# Patient Record
Sex: Male | Born: 1960 | Hispanic: No | Marital: Married | Smoking: Never smoker
Health system: Southern US, Community
[De-identification: ages and names within clinical notes are randomized; demographics above are authoritative.]

## PROBLEM LIST (undated history)

## (undated) HISTORY — PX: COLONOSCOPY: SHX174

---

## 2004-06-13 ENCOUNTER — Encounter: Admission: RE | Admit: 2004-06-13 | Discharge: 2004-06-13 | Payer: Self-pay | Admitting: Family Medicine

## 2006-03-17 IMAGING — US US ABDOMEN COMPLETE
1 series · 14 of 25 positions shown · non-contrast
Comparison: none

CLINICAL DATA: Left upper quadrant pain.
 ULTRASOUND ABDOMEN, COMPLETE:
 There is a fold noted within the gallbladder.  There is no evidence for cholelithiasis and there is no pericholecystic fluid.  The gallbladder wall thickness is normal.  The common bile duct is normal in caliber (3 mm).  The liver, spleen, and kidneys have normal appearance.  There is limited visualization of the inferior vena cava, abdominal aorta, and pancreas due to overlying gas.

[Series 1: unknown · 0.27mm/px · 14 of 62 slices shown]
[im 1/62]
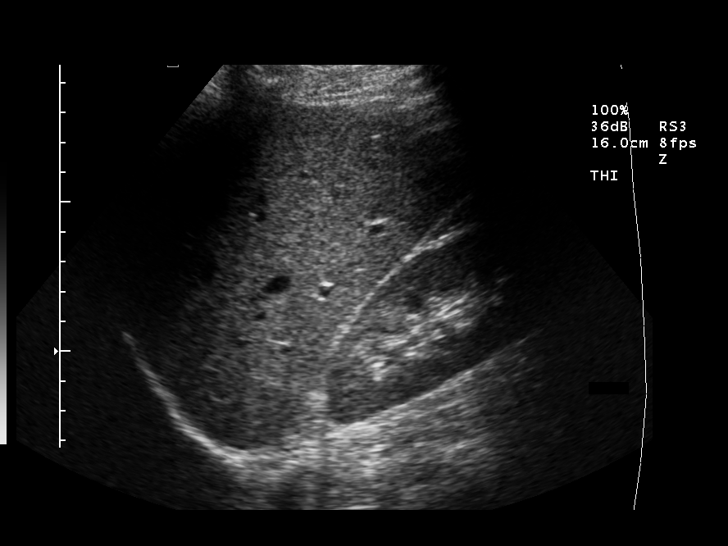
[im 6/62]
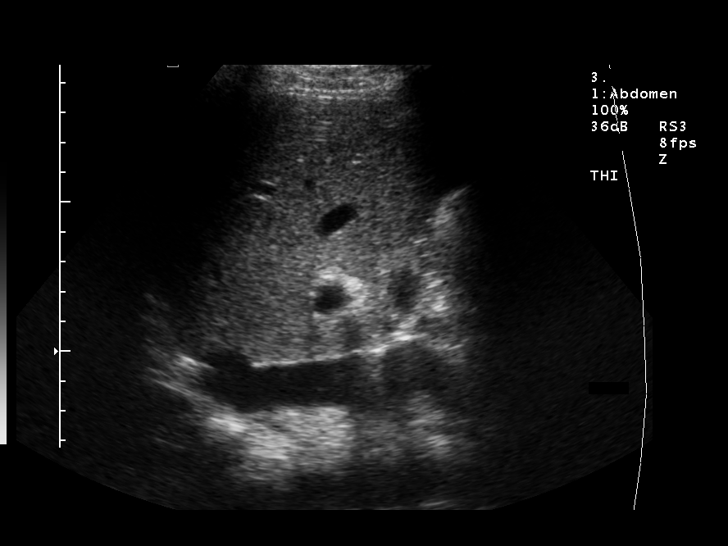
[im 11/62]
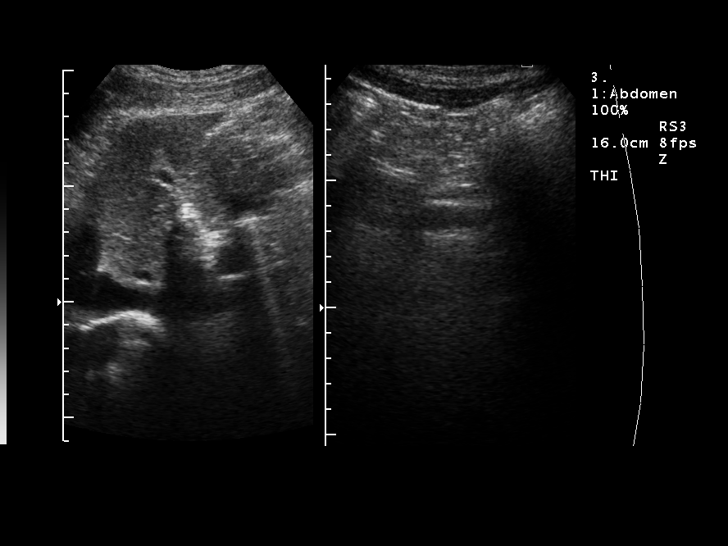
[im 16/62]
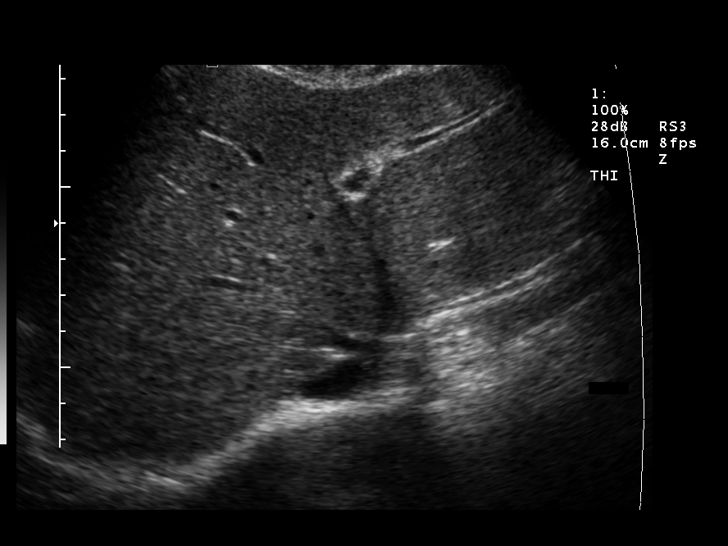
[im 21/62]
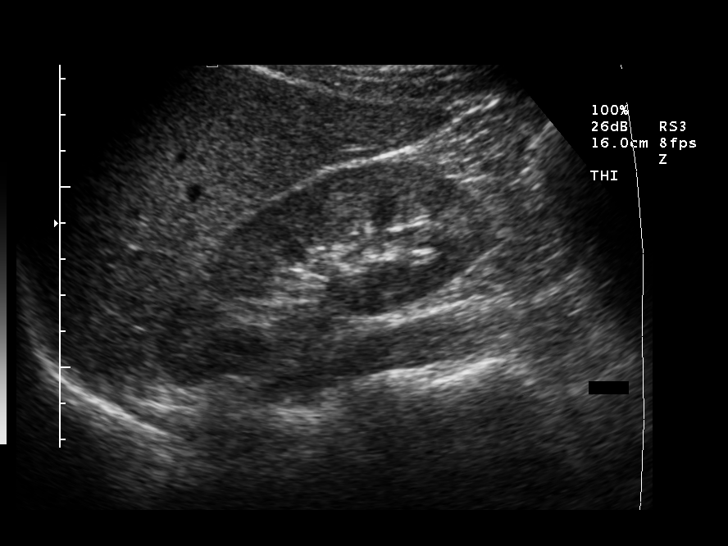
[im 23/62]
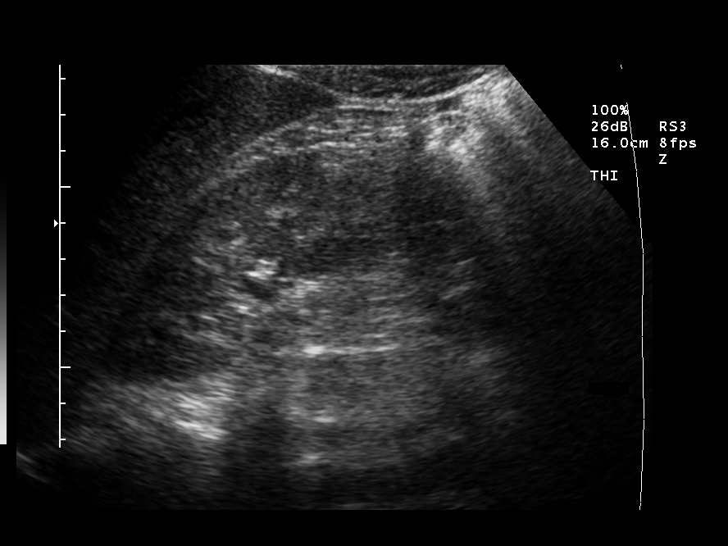
[im 28/62]
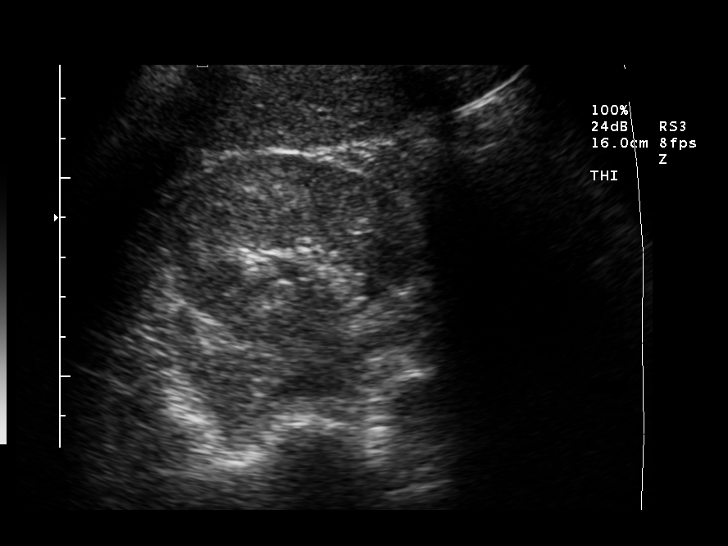
[im 34/62]
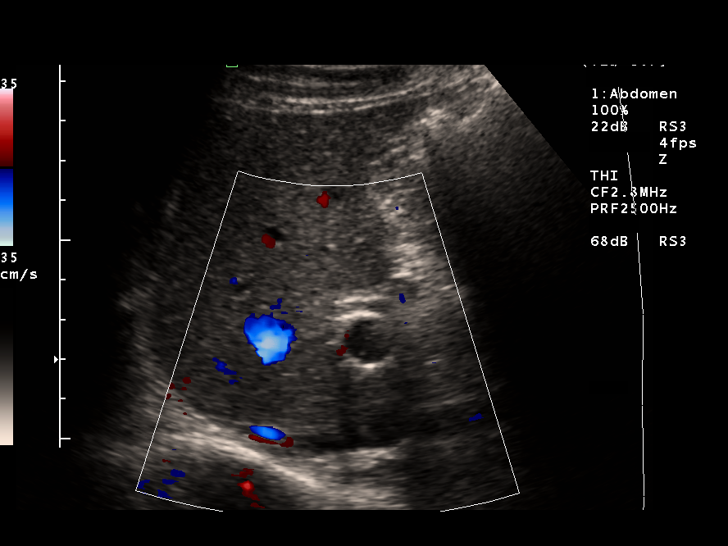
[im 39/62]
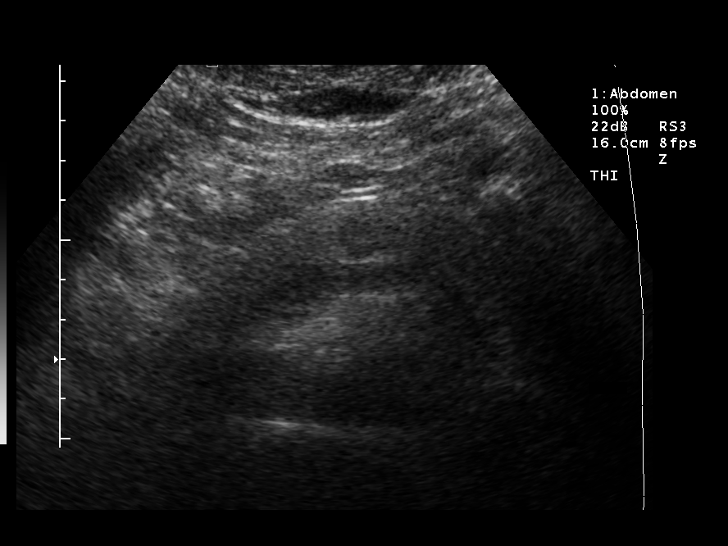
[im 41/62]
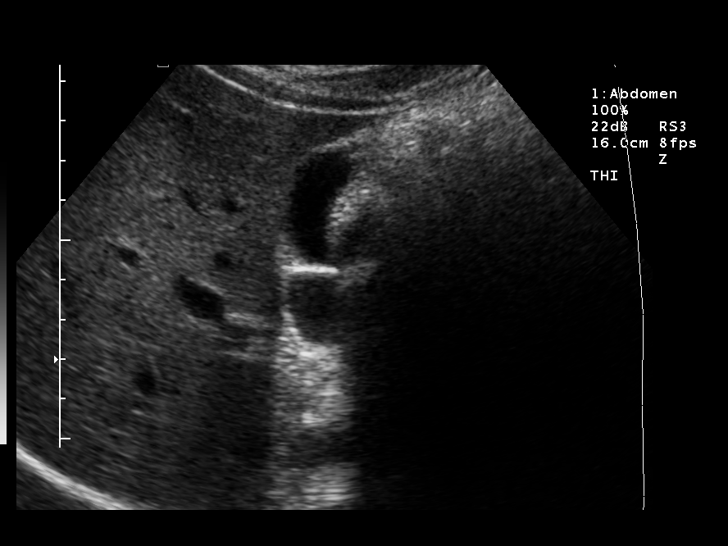
[im 46/62]
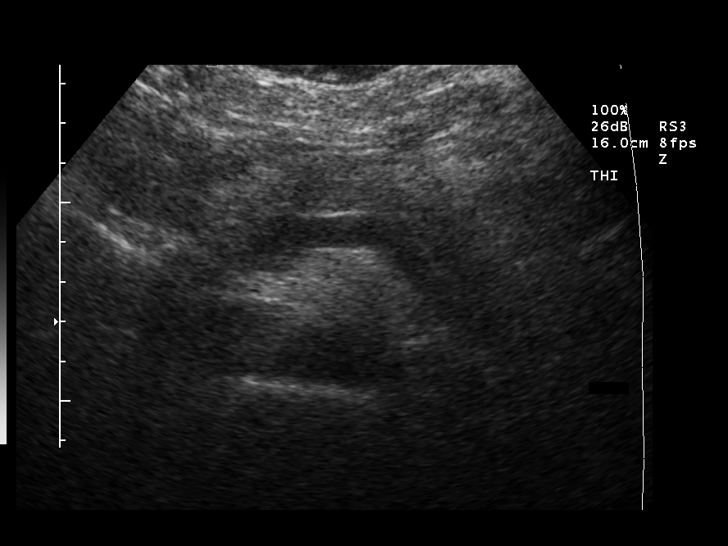
[im 51/62]
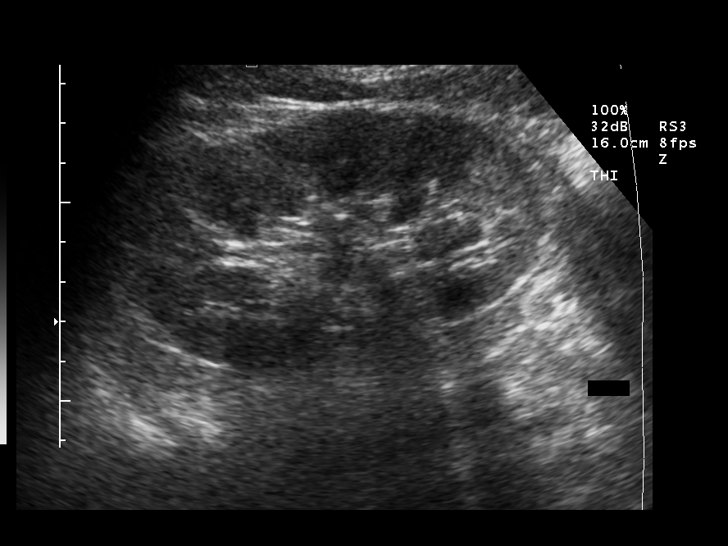
[im 56/62]
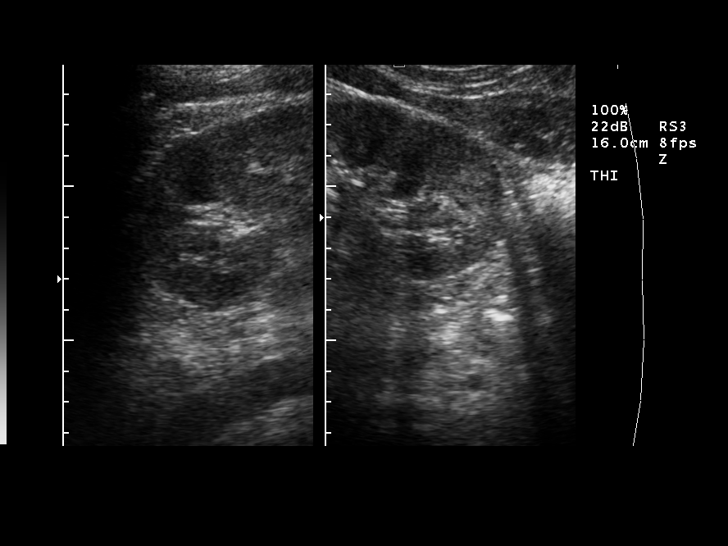
[im 62/62]
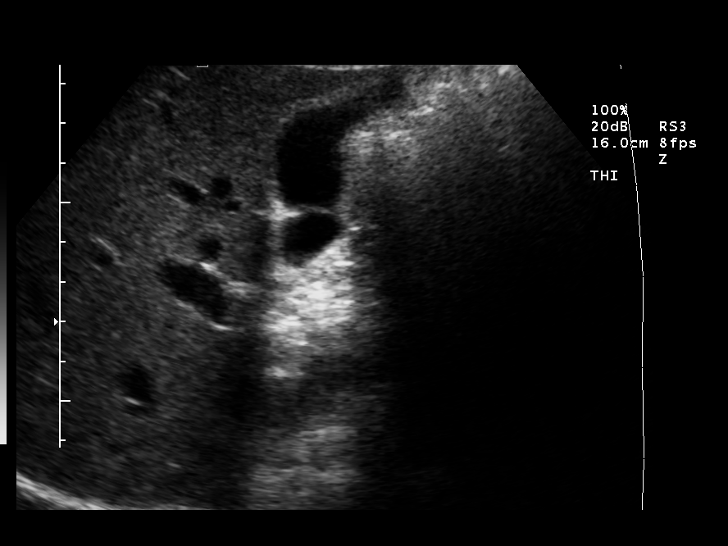

[14 of 25 positions shown; findings below may reference images not displayed]

IMPRESSION: Normal appearing gallbladder and common bile duct.  Limited visualization of the inferior vena cava, aorta, and pancreas due to overlying gas.

## 2010-07-15 ENCOUNTER — Encounter: Payer: Self-pay | Admitting: Family Medicine

## 2011-05-23 DIAGNOSIS — J309 Allergic rhinitis, unspecified: Secondary | ICD-10-CM | POA: Insufficient documentation

## 2011-05-23 DIAGNOSIS — Z87448 Personal history of other diseases of urinary system: Secondary | ICD-10-CM | POA: Insufficient documentation

## 2011-05-23 DIAGNOSIS — N411 Chronic prostatitis: Secondary | ICD-10-CM | POA: Insufficient documentation

## 2011-05-23 DIAGNOSIS — G44209 Tension-type headache, unspecified, not intractable: Secondary | ICD-10-CM | POA: Insufficient documentation

## 2011-05-23 DIAGNOSIS — J45909 Unspecified asthma, uncomplicated: Secondary | ICD-10-CM | POA: Insufficient documentation

## 2011-05-23 DIAGNOSIS — N400 Enlarged prostate without lower urinary tract symptoms: Secondary | ICD-10-CM | POA: Insufficient documentation

## 2011-05-23 DIAGNOSIS — A159 Respiratory tuberculosis unspecified: Secondary | ICD-10-CM | POA: Insufficient documentation

## 2011-05-24 DIAGNOSIS — Z0000 Encounter for general adult medical examination without abnormal findings: Secondary | ICD-10-CM | POA: Insufficient documentation

## 2011-12-20 DIAGNOSIS — R079 Chest pain, unspecified: Secondary | ICD-10-CM | POA: Insufficient documentation

## 2016-12-03 DIAGNOSIS — Z0000 Encounter for general adult medical examination without abnormal findings: Secondary | ICD-10-CM | POA: Diagnosis not present

## 2016-12-03 DIAGNOSIS — Z125 Encounter for screening for malignant neoplasm of prostate: Secondary | ICD-10-CM | POA: Diagnosis not present

## 2016-12-03 DIAGNOSIS — Z6820 Body mass index (BMI) 20.0-20.9, adult: Secondary | ICD-10-CM | POA: Diagnosis not present

## 2017-03-15 DIAGNOSIS — J069 Acute upper respiratory infection, unspecified: Secondary | ICD-10-CM | POA: Diagnosis not present

## 2017-03-29 DIAGNOSIS — J203 Acute bronchitis due to coxsackievirus: Secondary | ICD-10-CM | POA: Diagnosis not present

## 2017-04-19 DIAGNOSIS — J302 Other seasonal allergic rhinitis: Secondary | ICD-10-CM | POA: Diagnosis not present

## 2017-04-19 DIAGNOSIS — R05 Cough: Secondary | ICD-10-CM | POA: Diagnosis not present

## 2017-05-01 DIAGNOSIS — J302 Other seasonal allergic rhinitis: Secondary | ICD-10-CM | POA: Diagnosis not present

## 2017-05-01 DIAGNOSIS — R05 Cough: Secondary | ICD-10-CM | POA: Diagnosis not present

## 2017-05-13 DIAGNOSIS — J40 Bronchitis, not specified as acute or chronic: Secondary | ICD-10-CM | POA: Diagnosis not present

## 2017-05-22 DIAGNOSIS — J40 Bronchitis, not specified as acute or chronic: Secondary | ICD-10-CM | POA: Diagnosis not present

## 2018-03-03 DIAGNOSIS — J3089 Other allergic rhinitis: Secondary | ICD-10-CM | POA: Diagnosis not present

## 2018-03-03 DIAGNOSIS — Z0000 Encounter for general adult medical examination without abnormal findings: Secondary | ICD-10-CM | POA: Diagnosis not present

## 2018-05-16 DIAGNOSIS — J302 Other seasonal allergic rhinitis: Secondary | ICD-10-CM | POA: Diagnosis not present

## 2018-06-30 DIAGNOSIS — J101 Influenza due to other identified influenza virus with other respiratory manifestations: Secondary | ICD-10-CM | POA: Diagnosis not present

## 2018-06-30 DIAGNOSIS — R509 Fever, unspecified: Secondary | ICD-10-CM | POA: Diagnosis not present

## 2019-05-27 DIAGNOSIS — Z6820 Body mass index (BMI) 20.0-20.9, adult: Secondary | ICD-10-CM | POA: Diagnosis not present

## 2019-05-27 DIAGNOSIS — M25562 Pain in left knee: Secondary | ICD-10-CM | POA: Diagnosis not present

## 2019-05-27 DIAGNOSIS — G8929 Other chronic pain: Secondary | ICD-10-CM | POA: Diagnosis not present

## 2019-05-27 DIAGNOSIS — Z0000 Encounter for general adult medical examination without abnormal findings: Secondary | ICD-10-CM | POA: Diagnosis not present

## 2022-08-06 ENCOUNTER — Encounter: Payer: Self-pay | Admitting: Hematology and Oncology

## 2022-08-06 ENCOUNTER — Other Ambulatory Visit: Payer: Self-pay | Admitting: Hematology and Oncology

## 2022-08-06 ENCOUNTER — Inpatient Hospital Stay: Payer: No Typology Code available for payment source

## 2022-08-06 ENCOUNTER — Inpatient Hospital Stay
Payer: No Typology Code available for payment source | Attending: Hematology and Oncology | Admitting: Hematology and Oncology

## 2022-08-06 ENCOUNTER — Other Ambulatory Visit: Payer: Self-pay

## 2022-08-06 VITALS — BP 140/80 | HR 60 | Temp 97.7°F | Resp 18 | Wt 146.0 lb

## 2022-08-06 DIAGNOSIS — D75839 Thrombocytosis, unspecified: Secondary | ICD-10-CM | POA: Diagnosis present

## 2022-08-06 DIAGNOSIS — Z7982 Long term (current) use of aspirin: Secondary | ICD-10-CM | POA: Diagnosis not present

## 2022-08-06 DIAGNOSIS — D473 Essential (hemorrhagic) thrombocythemia: Secondary | ICD-10-CM | POA: Diagnosis not present

## 2022-08-06 NOTE — Progress Notes (Signed)
[place] [place] CONSULT NOTE  Patient Care Team: Default, Provider, MD as PCP - General  ASSESSMENT & PLAN Thrombocytosis He has thrombocytosis since 2020 With borderline elevated eosinophil count, I suspect he may have undiagnosed myeloproliferative disorder such as essential thrombocytosis I will also order treatable causes such as iron deficiency as potential cause of reactive thrombocytosis We discussed blood work now; depending on outcome of his blood work, we may or may not need to proceed with bone marrow aspirate and biopsy I will order test with next generation sequencing to look for CML, ET and MPD/MPN I recommend the patient to increase oral fluid intake and to start aspirin therapy   Orders Placed This Encounter  Procedures   CBC with Differential ([facility] Only)    Standing Status:   Future    Number of Occurrences:   1    Standing Expiration Date:   08/07/2023   Sedimentation rate    Standing Status:   Future    Number of Occurrences:   1    Standing Expiration Date:   08/07/2023   Ferritin    Standing Status:   Future    Number of Occurrences:   1    Standing Expiration Date:   08/06/2023   Iron and Iron Binding Capacity ([facility] only)    Standing Status:   Future    Number of Occurrences:   1    Standing Expiration Date:   08/07/2023   JAK2 (INCLUDING V617F AND EXON 12), MPL,& CALR W/RFL MPN PANEL ([facility])    Standing Status:   Future    Number of Occurrences:   1    Standing Expiration Date:   08/06/2023   BCR ABL1 FISH ([facility])    Standing Status:   Future    Number of Occurrences:   1    Standing Expiration Date:   08/07/2023   CMP ([place] only)    Standing Status:   Future    Number of Occurrences:   1    Standing Expiration Date:   08/07/2023   Uric acid    Standing Status:   Future    Number of Occurrences:   1    Standing Expiration Date:   08/07/2023   All questions were answered. The patient knows to call the clinic with any  problems, questions or concerns. No barriers to learning was detected. The total time spent in the appointment was 60 minutes encounter with patients including review of chart and various tests results, discussions about plan of care and coordination of care plan  Heath Lark, MD 08/06/2022 11:49 AM  CHIEF COMPLAINTS/PURPOSE OF CONSULTATION:  Thrombocytosis  HISTORY OF PRESENTING ILLNESS:  Grant Brooks 62 y.o. male is here because of thrombocytosis  He was found to have abnormal CBC from abnormal blood work through primary care doctors office I have the opportunity to review his electronic records dated back to 2013 On December 21, 2011, his platelet count was 262 On May 27, 2019, his platelet count was 534 On June 07, 2020, his platelet count is 544 On June 28, 2021, his platelet count is 532 On July 12, 2022, his platelet count is 654  He denies diagnosis of blood clot He denies active infection although he is aware of poor dentition requiring deep cleaning  MEDICAL HISTORY:  History reviewed. No pertinent past medical history.  SURGICAL HISTORY: Past Surgical History:  Procedure Laterality Date   COLONOSCOPY      SOCIAL HISTORY: Social  History   Socioeconomic History   Marital status: Married    Spouse name: Not on file   Number of children: 3   Years of education: Not on file   Highest education level: Not on file  Occupational History   Occupation: banker  Tobacco Use   Smoking status: Never    Passive exposure: Never   Smokeless tobacco: Never  Substance and Sexual Activity   Alcohol use: Not on file   Drug use: Not on file   Sexual activity: Not on file  Other Topics Concern   Not on file  Social History Narrative   Not on file   Social Determinants of Health   Financial Resource Strain: Not on file  Food Insecurity: Not on file  Transportation Needs: Not on file  Physical Activity: Not on file  Stress: Not on file  Social Connections: Not  on file  Intimate Partner Violence: Not on file    FAMILY HISTORY: Family History  Problem Relation Age of Onset   Parkinson's disease Father     ALLERGIES:  has no allergies on file.  MEDICATIONS:  Current Outpatient Medications  Medication Sig Dispense Refill   levocetirizine (XYZAL) 5 MG tablet Take by mouth.     No current facility-administered medications for this visit.    REVIEW OF SYSTEMS:   Constitutional: Denies fevers, chills or abnormal night sweats Eyes: Denies blurriness of vision, double vision or watery eyes Ears, nose, mouth, throat, and face: Denies mucositis or sore throat Respiratory: Denies cough, dyspnea or wheezes Cardiovascular: Denies palpitation, chest discomfort or lower extremity swelling Gastrointestinal:  Denies nausea, heartburn or change in bowel habits Skin: Denies abnormal skin rashes Lymphatics: Denies new lymphadenopathy or easy bruising Neurological:Denies numbness, tingling or new weaknesses Behavioral/Psych: Mood is stable, no new changes  All other systems were reviewed with the patient and are negative.  PHYSICAL EXAMINATION: ECOG PERFORMANCE STATUS: 0 - Asymptomatic  Vitals:   08/06/22 1116  BP: (!) 140/80  Pulse: 60  Resp: 18  Temp: 97.7 F (36.5 C)  SpO2: 97%   Filed Weights   08/06/22 1116  Weight: 146 lb (66.2 kg)    GENERAL:alert, no distress and comfortable SKIN: skin color, texture, turgor are normal, no rashes or significant lesions EYES: normal, conjunctiva are pink and non-injected, sclera clear OROPHARYNX:no exudate, no erythema and lips, buccal mucosa, and tongue normal.  Noted gingivitis but no signs of active infection NECK: supple, thyroid normal size, non-tender, without nodularity LYMPH:  no palpable lymphadenopathy in the cervical, axillary or inguinal LUNGS: clear to auscultation and percussion with normal breathing effort HEART: regular rate & rhythm and no murmurs and no lower extremity  edema ABDOMEN:abdomen soft, non-tender and normal bowel sounds.  No splenomegaly Musculoskeletal:no cyanosis of digits and no clubbing  PSYCH: alert & oriented x 3 with fluent speech NEURO: no focal motor/sensory deficits  LABORATORY DATA:  I have reviewed the data as listed in his electronic medical records through [facility]

## 2022-08-06 NOTE — Assessment & Plan Note (Signed)
He has thrombocytosis since 2020 With borderline elevated eosinophil count, I suspect he may have undiagnosed myeloproliferative disorder such as essential thrombocytosis I will also order treatable causes such as iron deficiency as potential cause of reactive thrombocytosis We discussed blood work now; depending on outcome of his blood work, we may or may not need to proceed with bone marrow aspirate and biopsy I will order test with next generation sequencing to look for CML, ET and MPD/MPN I recommend the patient to increase oral fluid intake and to start aspirin therapy

## 2022-08-07 ENCOUNTER — Inpatient Hospital Stay: Payer: No Typology Code available for payment source

## 2022-08-07 DIAGNOSIS — D75839 Thrombocytosis, unspecified: Secondary | ICD-10-CM | POA: Diagnosis not present

## 2022-08-07 LAB — IRON AND IRON BINDING CAPACITY (CC-WL,HP ONLY)
Iron: 75 ug/dL (ref 45–182)
Saturation Ratios: 25 % (ref 17.9–39.5)
TIBC: 298 ug/dL (ref 250–450)
UIBC: 223 ug/dL (ref 117–376)

## 2022-08-07 LAB — CBC WITH DIFFERENTIAL (CANCER CENTER ONLY)
Abs Immature Granulocytes: 0.05 10*3/uL (ref 0.00–0.07)
Basophils Absolute: 0.1 10*3/uL (ref 0.0–0.1)
Basophils Relative: 1 %
Eosinophils Absolute: 0.6 10*3/uL — ABNORMAL HIGH (ref 0.0–0.5)
Eosinophils Relative: 7 %
HCT: 47.6 % (ref 39.0–52.0)
Hemoglobin: 15.9 g/dL (ref 13.0–17.0)
Immature Granulocytes: 1 %
Lymphocytes Relative: 15 %
Lymphs Abs: 1.3 10*3/uL (ref 0.7–4.0)
MCH: 29.5 pg (ref 26.0–34.0)
MCHC: 33.4 g/dL (ref 30.0–36.0)
MCV: 88.3 fL (ref 80.0–100.0)
Monocytes Absolute: 0.4 10*3/uL (ref 0.1–1.0)
Monocytes Relative: 5 %
Neutro Abs: 6.4 10*3/uL (ref 1.7–7.7)
Neutrophils Relative %: 71 %
Platelet Count: 587 10*3/uL — ABNORMAL HIGH (ref 150–400)
RBC: 5.39 MIL/uL (ref 4.22–5.81)
RDW: 13.2 % (ref 11.5–15.5)
WBC Count: 8.9 10*3/uL (ref 4.0–10.5)
nRBC: 0 % (ref 0.0–0.2)

## 2022-08-07 LAB — URIC ACID: Uric Acid, Serum: 5.2 mg/dL (ref 3.7–8.6)

## 2022-08-07 LAB — CMP (CANCER CENTER ONLY)
ALT: 18 U/L (ref 0–44)
AST: 16 U/L (ref 15–41)
Albumin: 4.3 g/dL (ref 3.5–5.0)
Alkaline Phosphatase: 53 U/L (ref 38–126)
Anion gap: 3 — ABNORMAL LOW (ref 5–15)
BUN: 20 mg/dL (ref 8–23)
CO2: 32 mmol/L (ref 22–32)
Calcium: 9 mg/dL (ref 8.9–10.3)
Chloride: 103 mmol/L (ref 98–111)
Creatinine: 1.01 mg/dL (ref 0.61–1.24)
GFR, Estimated: >60 mL/min (ref >60)
Glucose, Bld: 115 mg/dL — ABNORMAL HIGH (ref 70–99)
Potassium: 4.3 mmol/L (ref 3.5–5.1)
Sodium: 138 mmol/L (ref 135–145)
Total Bilirubin: 0.5 mg/dL (ref 0.3–1.2)
Total Protein: 6.8 g/dL (ref 6.5–8.1)

## 2022-08-07 LAB — FERRITIN: Ferritin: 106 ng/mL (ref 24–336)

## 2022-08-07 LAB — SEDIMENTATION RATE: Sed Rate: 4 mm/hr (ref 0–16)

## 2022-08-15 LAB — BCR ABL1 FISH (GENPATH)

## 2022-08-15 LAB — JAK2 (INCLUDING V617F AND EXON 12), MPL,& CALR W/RFL MPN PANEL (NGS)

## 2022-08-17 ENCOUNTER — Encounter: Payer: Self-pay | Admitting: Hematology and Oncology

## 2022-08-20 ENCOUNTER — Encounter: Payer: Self-pay | Admitting: Hematology and Oncology

## 2022-08-20 ENCOUNTER — Inpatient Hospital Stay (HOSPITAL_BASED_OUTPATIENT_CLINIC_OR_DEPARTMENT_OTHER): Payer: No Typology Code available for payment source | Admitting: Hematology and Oncology

## 2022-08-20 ENCOUNTER — Other Ambulatory Visit: Payer: Self-pay

## 2022-08-20 VITALS — BP 135/78 | HR 61 | Temp 98.1°F | Resp 18 | Wt 147.6 lb

## 2022-08-20 DIAGNOSIS — D473 Essential (hemorrhagic) thrombocythemia: Secondary | ICD-10-CM | POA: Diagnosis not present

## 2022-08-20 DIAGNOSIS — D75839 Thrombocytosis, unspecified: Secondary | ICD-10-CM | POA: Diagnosis not present

## 2022-08-20 MED ORDER — HYDROXYUREA 500 MG PO CAPS: 500.0000 mg | ORAL_CAPSULE | Freq: Every day | ORAL | 3 refills | Status: DC

## 2022-08-20 NOTE — Progress Notes (Signed)
[facility] OFFICE PROGRESS NOTE  Patient Care Team: Default, Provider, MD as PCP - General  ASSESSMENT & PLAN:  Essential thrombocytosis ([place]) We discussed the natural history of essential thrombocytosis While the disease is not curable, is highly treatable I reminded him the importance of taking 81 mg aspirin to prevent risk of blood clots We discussed the risk, benefits, side effects of hydroxyurea and he is in agreement to proceed I will also get him started on 500 mg daily and see him back next week for further follow-up  Orders Placed This Encounter  Procedures   CBC with Differential/Platelet    Standing Status:   Standing    Number of Occurrences:   22    Standing Expiration Date:   08/21/2023    All questions were answered. The patient knows to call the clinic with any problems, questions or concerns. The total time spent in the appointment was 25 minutes encounter with patients including review of chart and various tests results, discussions about plan of care and coordination of care plan   Heath Lark, MD 08/20/2022 2:56 PM  INTERVAL HISTORY: Please see below for problem oriented charting. he returns for treatment follow-up and review test results I gave him copies of his test and discussed treatment option  REVIEW OF SYSTEMS:   Constitutional: Denies fevers, chills or abnormal weight loss Eyes: Denies blurriness of vision Ears, nose, mouth, throat, and face: Denies mucositis or sore throat Respiratory: Denies cough, dyspnea or wheezes Cardiovascular: Denies palpitation, chest discomfort or lower extremity swelling Gastrointestinal:  Denies nausea, heartburn or change in bowel habits Skin: Denies abnormal skin rashes Lymphatics: Denies new lymphadenopathy or easy bruising Neurological:Denies numbness, tingling or new weaknesses Behavioral/Psych: Mood is stable, no new changes  All other systems were reviewed with the patient and are negative.  I  have reviewed the past medical history, past surgical history, social history and family history with the patient and they are unchanged from previous note.  ALLERGIES:  has no allergies on file.  MEDICATIONS:  Current Outpatient Medications  Medication Sig Dispense Refill   aspirin EC 81 MG tablet Take 81 mg by mouth daily. Swallow whole.     hydroxyurea (HYDREA) 500 MG capsule Take 1 capsule (500 mg total) by mouth daily. May take with food to minimize GI side effects. 30 capsule 3   levocetirizine (XYZAL) 5 MG tablet Take by mouth.     No current facility-administered medications for this visit.    SUMMARY OF ONCOLOGIC HISTORY: Oncology History  Essential thrombocytosis ([place])  08/06/2022 Initial Diagnosis   Essential thrombocytosis ([place])   08/07/2022 Pathology Results   Peripheral blood is positive for JAK2 mutation, BCR/ABL negative   08/20/2022 -  Chemotherapy   He is started on hydroxyurea     PHYSICAL EXAMINATION: ECOG PERFORMANCE STATUS: 0 - Asymptomatic  Vitals:   08/20/22 1355  BP: 135/78  Pulse: 61  Resp: 18  Temp: 98.1 F (36.7 C)  SpO2: 98%   Filed Weights   08/20/22 1355  Weight: 147 lb 9.6 oz (67 kg)    GENERAL:alert, no distress and comfortable  NEURO: alert & oriented x 3 with fluent speech, no focal motor/sensory deficits  LABORATORY DATA:  I have reviewed the data as listed    Component Value Date/Time   NA 138 08/07/2022 1341   K 4.3 08/07/2022 1341   CL 103 08/07/2022 1341   CO2 32 08/07/2022 1341   GLUCOSE 115 (H) 08/07/2022 1341  BUN 20 08/07/2022 1341   CREATININE 1.01 08/07/2022 1341   CALCIUM 9.0 08/07/2022 1341   PROT 6.8 08/07/2022 1341   ALBUMIN 4.3 08/07/2022 1341   AST 16 08/07/2022 1341   ALT 18 08/07/2022 1341   ALKPHOS 53 08/07/2022 1341   BILITOT 0.5 08/07/2022 1341   GFRNONAA >60 08/07/2022 1341    No results found for: "SPEP", "UPEP"  Lab Results  Component Value Date   WBC 8.9 08/07/2022   NEUTROABS 6.4  08/07/2022   HGB 15.9 08/07/2022   HCT 47.6 08/07/2022   MCV 88.3 08/07/2022   PLT 587 (H) 08/07/2022      Chemistry      Component Value Date/Time   NA 138 08/07/2022 1341   K 4.3 08/07/2022 1341   CL 103 08/07/2022 1341   CO2 32 08/07/2022 1341   BUN 20 08/07/2022 1341   CREATININE 1.01 08/07/2022 1341      Component Value Date/Time   CALCIUM 9.0 08/07/2022 1341   ALKPHOS 53 08/07/2022 1341   AST 16 08/07/2022 1341   ALT 18 08/07/2022 1341   BILITOT 0.5 08/07/2022 1341

## 2022-08-20 NOTE — Assessment & Plan Note (Signed)
We discussed the natural history of essential thrombocytosis While the disease is not curable, is highly treatable I reminded him the importance of taking 81 mg aspirin to prevent risk of blood clots We discussed the risk, benefits, side effects of hydroxyurea and he is in agreement to proceed I will also get him started on 500 mg daily and see him back next week for further follow-up

## 2022-08-30 ENCOUNTER — Encounter: Payer: Self-pay | Admitting: Hematology and Oncology

## 2022-08-30 ENCOUNTER — Other Ambulatory Visit: Payer: Self-pay

## 2022-08-30 ENCOUNTER — Inpatient Hospital Stay: Payer: No Typology Code available for payment source | Attending: Hematology and Oncology

## 2022-08-30 ENCOUNTER — Inpatient Hospital Stay: Payer: No Typology Code available for payment source | Admitting: Hematology and Oncology

## 2022-08-30 VITALS — BP 142/73 | HR 64 | Temp 97.7°F | Resp 16 | Wt 146.6 lb

## 2022-08-30 DIAGNOSIS — D75839 Thrombocytosis, unspecified: Secondary | ICD-10-CM | POA: Diagnosis present

## 2022-08-30 DIAGNOSIS — D473 Essential (hemorrhagic) thrombocythemia: Secondary | ICD-10-CM | POA: Diagnosis not present

## 2022-08-30 LAB — CBC WITH DIFFERENTIAL/PLATELET
Abs Immature Granulocytes: 0.03 10*3/uL (ref 0.00–0.07)
Basophils Absolute: 0.1 10*3/uL (ref 0.0–0.1)
Basophils Relative: 1 %
Eosinophils Absolute: 0.6 10*3/uL — ABNORMAL HIGH (ref 0.0–0.5)
Eosinophils Relative: 8 %
HCT: 45.4 % (ref 39.0–52.0)
Hemoglobin: 15.2 g/dL (ref 13.0–17.0)
Immature Granulocytes: 0 %
Lymphocytes Relative: 19 %
Lymphs Abs: 1.4 10*3/uL (ref 0.7–4.0)
MCH: 29.9 pg (ref 26.0–34.0)
MCHC: 33.5 g/dL (ref 30.0–36.0)
MCV: 89.2 fL (ref 80.0–100.0)
Monocytes Absolute: 0.4 10*3/uL (ref 0.1–1.0)
Monocytes Relative: 5 %
Neutro Abs: 5 10*3/uL (ref 1.7–7.7)
Neutrophils Relative %: 67 %
Platelets: 569 10*3/uL — ABNORMAL HIGH (ref 150–400)
RBC: 5.09 MIL/uL (ref 4.22–5.81)
RDW: 13.4 % (ref 11.5–15.5)
WBC: 7.5 10*3/uL (ref 4.0–10.5)
nRBC: 0 % (ref 0.0–0.2)

## 2022-08-30 MED ORDER — HYDROXYUREA 500 MG PO CAPS: ORAL_CAPSULE | ORAL | 3 refills | Status: DC

## 2022-08-30 NOTE — Progress Notes (Signed)
[facility] OFFICE PROGRESS NOTE  Patient Care Team: Default, Provider, MD as PCP - General  ASSESSMENT & PLAN:  Essential thrombocytosis ([place]) Overall, he has positive response to treatment However, the platelet count is barely reduced I recommend increasing the dose of hydroxyurea For 3 times a week on Mondays, Wednesdays and Fridays, he will take 1000 mg For the rest of the week, he will take 500 mg He will continue aspirin therapy I will see him again in 2 weeks for further follow-up  No orders of the defined types were placed in this encounter.   All questions were answered. The patient knows to call the clinic with any problems, questions or concerns. The total time spent in the appointment was 20 minutes encounter with patients including review of chart and various tests results, discussions about plan of care and coordination of care plan   Heath Lark, MD 08/30/2022 3:11 PM  INTERVAL HISTORY: Please see below for problem oriented charting. he returns for treatment follow-up on hydroxyurea He tolerated treatment well No side effects Denies recent infection  REVIEW OF SYSTEMS:   Constitutional: Denies fevers, chills or abnormal weight loss Eyes: Denies blurriness of vision Ears, nose, mouth, throat, and face: Denies mucositis or sore throat Respiratory: Denies cough, dyspnea or wheezes Cardiovascular: Denies palpitation, chest discomfort or lower extremity swelling Gastrointestinal:  Denies nausea, heartburn or change in bowel habits Skin: Denies abnormal skin rashes Lymphatics: Denies new lymphadenopathy or easy bruising Neurological:Denies numbness, tingling or new weaknesses Behavioral/Psych: Mood is stable, no new changes  All other systems were reviewed with the patient and are negative.  I have reviewed the past medical history, past surgical history, social history and family history with the patient and they are unchanged from previous  note.  ALLERGIES:  has no allergies on file.  MEDICATIONS:  Current Outpatient Medications  Medication Sig Dispense Refill   aspirin EC 81 MG tablet Take 81 mg by mouth daily. Swallow whole.     hydroxyurea (HYDREA) 500 MG capsule Take 2 capsules on Mondays, Wednesdays and Fridays, and 1 capsule for the rest of the week 60 capsule 3   levocetirizine (XYZAL) 5 MG tablet Take by mouth.     No current facility-administered medications for this visit.    SUMMARY OF ONCOLOGIC HISTORY: Oncology History  Essential thrombocytosis ([place])  08/06/2022 Initial Diagnosis   Essential thrombocytosis ([place])   08/07/2022 Pathology Results   Peripheral blood is positive for JAK2 mutation, BCR/ABL negative   08/20/2022 -  Chemotherapy   He is started on hydroxyurea     PHYSICAL EXAMINATION: ECOG PERFORMANCE STATUS: 0 - Asymptomatic  Vitals:   08/30/22 1443  BP: (!) 142/73  Pulse: 64  Resp: 16  Temp: 97.7 F (36.5 C)  SpO2: 99%   Filed Weights   08/30/22 1443  Weight: 146 lb 9.6 oz (66.5 kg)    GENERAL:alert, no distress and comfortable  NEURO: alert & oriented x 3 with fluent speech, no focal motor/sensory deficits  LABORATORY DATA:  I have reviewed the data as listed    Component Value Date/Time   NA 138 08/07/2022 1341   K 4.3 08/07/2022 1341   CL 103 08/07/2022 1341   CO2 32 08/07/2022 1341   GLUCOSE 115 (H) 08/07/2022 1341   BUN 20 08/07/2022 1341   CREATININE 1.01 08/07/2022 1341   CALCIUM 9.0 08/07/2022 1341   PROT 6.8 08/07/2022 1341   ALBUMIN 4.3 08/07/2022 1341   AST 16 08/07/2022 1341  ALT 18 08/07/2022 1341   ALKPHOS 53 08/07/2022 1341   BILITOT 0.5 08/07/2022 1341   GFRNONAA >60 08/07/2022 1341    No results found for: "SPEP", "UPEP"  Lab Results  Component Value Date   WBC 7.5 08/30/2022   NEUTROABS 5.0 08/30/2022   HGB 15.2 08/30/2022   HCT 45.4 08/30/2022   MCV 89.2 08/30/2022   PLT 569 (H) 08/30/2022      Chemistry      Component Value  Date/Time   NA 138 08/07/2022 1341   K 4.3 08/07/2022 1341   CL 103 08/07/2022 1341   CO2 32 08/07/2022 1341   BUN 20 08/07/2022 1341   CREATININE 1.01 08/07/2022 1341      Component Value Date/Time   CALCIUM 9.0 08/07/2022 1341   ALKPHOS 53 08/07/2022 1341   AST 16 08/07/2022 1341   ALT 18 08/07/2022 1341   BILITOT 0.5 08/07/2022 1341

## 2022-08-30 NOTE — Assessment & Plan Note (Signed)
Overall, he has positive response to treatment However, the platelet count is barely reduced I recommend increasing the dose of hydroxyurea For 3 times a week on Mondays, Wednesdays and Fridays, he will take 1000 mg For the rest of the week, he will take 500 mg He will continue aspirin therapy I will see him again in 2 weeks for further follow-up

## 2022-09-13 ENCOUNTER — Encounter: Payer: Self-pay | Admitting: Hematology and Oncology

## 2022-09-13 ENCOUNTER — Inpatient Hospital Stay: Payer: No Typology Code available for payment source

## 2022-09-13 ENCOUNTER — Other Ambulatory Visit: Payer: Self-pay

## 2022-09-13 ENCOUNTER — Inpatient Hospital Stay: Payer: No Typology Code available for payment source | Admitting: Hematology and Oncology

## 2022-09-13 VITALS — BP 123/72 | HR 62 | Temp 98.0°F | Resp 18 | Wt 147.2 lb

## 2022-09-13 DIAGNOSIS — D473 Essential (hemorrhagic) thrombocythemia: Secondary | ICD-10-CM

## 2022-09-13 LAB — CBC WITH DIFFERENTIAL/PLATELET
Abs Immature Granulocytes: 0.02 10*3/uL (ref 0.00–0.07)
Basophils Absolute: 0.1 10*3/uL (ref 0.0–0.1)
Basophils Relative: 1 %
Eosinophils Absolute: 0.5 10*3/uL (ref 0.0–0.5)
Eosinophils Relative: 7 %
HCT: 44.5 % (ref 39.0–52.0)
Hemoglobin: 15 g/dL (ref 13.0–17.0)
Immature Granulocytes: 0 %
Lymphocytes Relative: 14 %
Lymphs Abs: 0.9 10*3/uL (ref 0.7–4.0)
MCH: 30.5 pg (ref 26.0–34.0)
MCHC: 33.7 g/dL (ref 30.0–36.0)
MCV: 90.4 fL (ref 80.0–100.0)
Monocytes Absolute: 0.5 10*3/uL (ref 0.1–1.0)
Monocytes Relative: 8 %
Neutro Abs: 4.3 10*3/uL (ref 1.7–7.7)
Neutrophils Relative %: 70 %
Platelets: 409 10*3/uL — ABNORMAL HIGH (ref 150–400)
RBC: 4.92 MIL/uL (ref 4.22–5.81)
RDW: 14.6 % (ref 11.5–15.5)
WBC: 6.3 10*3/uL (ref 4.0–10.5)
nRBC: 0 % (ref 0.0–0.2)

## 2022-09-13 NOTE — Assessment & Plan Note (Signed)
He tolerated hydroxyurea very well His platelet count is almost normal He has no side effects from treatment at all I plan to see him back again in a month for further follow-up Due to his travel plans, I will see him on May 2

## 2022-09-13 NOTE — Progress Notes (Signed)
[place] OFFICE PROGRESS NOTE  Patient Care Team: Default, Provider, MD (Inactive) as PCP - General  ASSESSMENT & PLAN:  Essential thrombocytosis ([place]) He tolerated hydroxyurea very well His platelet count is almost normal He has no side effects from treatment at all I plan to see him back again in a month for further follow-up Due to his travel plans, I will see him on May 2  No orders of the defined types were placed in this encounter.   All questions were answered. The patient knows to call the clinic with any problems, questions or concerns. The total time spent in the appointment was 20 minutes encounter with patients including review of chart and various tests results, discussions about plan of care and coordination of care plan   Heath Lark, MD 09/13/2022 3:35 PM  INTERVAL HISTORY: Please see below for problem oriented charting. he returns for treatment follow-up on hydroxyurea He tolerated recent treatment well No side effects from treatment whatsoever  REVIEW OF SYSTEMS:   Constitutional: Denies fevers, chills or abnormal weight loss Eyes: Denies blurriness of vision Ears, nose, mouth, throat, and face: Denies mucositis or sore throat Respiratory: Denies cough, dyspnea or wheezes Cardiovascular: Denies palpitation, chest discomfort or lower extremity swelling Gastrointestinal:  Denies nausea, heartburn or change in bowel habits Skin: Denies abnormal skin rashes Lymphatics: Denies new lymphadenopathy or easy bruising Neurological:Denies numbness, tingling or new weaknesses Behavioral/Psych: Mood is stable, no new changes  All other systems were reviewed with the patient and are negative.  I have reviewed the past medical history, past surgical history, social history and family history with the patient and they are unchanged from previous note.  ALLERGIES:  has no allergies on file.  MEDICATIONS:  Current Outpatient Medications  Medication Sig  Dispense Refill   aspirin EC 81 MG tablet Take 81 mg by mouth daily. Swallow whole.     hydroxyurea (HYDREA) 500 MG capsule Take 2 capsules on Mondays, Wednesdays and Fridays, and 1 capsule for the rest of the week 60 capsule 3   levocetirizine (XYZAL) 5 MG tablet Take by mouth.     No current facility-administered medications for this visit.    SUMMARY OF ONCOLOGIC HISTORY: Oncology History  Essential thrombocytosis ([place])  08/06/2022 Initial Diagnosis   Essential thrombocytosis ([place])   08/07/2022 Pathology Results   Peripheral blood is positive for JAK2 mutation, BCR/ABL negative   08/20/2022 -  Chemotherapy   He is started on hydroxyurea     PHYSICAL EXAMINATION: ECOG PERFORMANCE STATUS: 0 - Asymptomatic  Vitals:   09/13/22 1437  BP: 123/72  Pulse: 62  Resp: 18  Temp: 98 F (36.7 C)  SpO2: 99%   Filed Weights   09/13/22 1437  Weight: 147 lb 3.2 oz (66.8 kg)    GENERAL:alert, no distress and comfortable  NEURO: alert & oriented x 3 with fluent speech, no focal motor/sensory deficits  LABORATORY DATA:  I have reviewed the data as listed    Component Value Date/Time   NA 138 08/07/2022 1341   K 4.3 08/07/2022 1341   CL 103 08/07/2022 1341   CO2 32 08/07/2022 1341   GLUCOSE 115 (H) 08/07/2022 1341   BUN 20 08/07/2022 1341   CREATININE 1.01 08/07/2022 1341   CALCIUM 9.0 08/07/2022 1341   PROT 6.8 08/07/2022 1341   ALBUMIN 4.3 08/07/2022 1341   AST 16 08/07/2022 1341   ALT 18 08/07/2022 1341   ALKPHOS 53 08/07/2022 1341   BILITOT 0.5 08/07/2022 1341  GFRNONAA >60 08/07/2022 1341    No results found for: "SPEP", "UPEP"  Lab Results  Component Value Date   WBC 6.3 09/13/2022   NEUTROABS 4.3 09/13/2022   HGB 15.0 09/13/2022   HCT 44.5 09/13/2022   MCV 90.4 09/13/2022   PLT 409 (H) 09/13/2022      Chemistry      Component Value Date/Time   NA 138 08/07/2022 1341   K 4.3 08/07/2022 1341   CL 103 08/07/2022 1341   CO2 32 08/07/2022 1341   BUN 20  08/07/2022 1341   CREATININE 1.01 08/07/2022 1341      Component Value Date/Time   CALCIUM 9.0 08/07/2022 1341   ALKPHOS 53 08/07/2022 1341   AST 16 08/07/2022 1341   ALT 18 08/07/2022 1341   BILITOT 0.5 08/07/2022 1341

## 2022-10-25 ENCOUNTER — Encounter: Payer: Self-pay | Admitting: Hematology and Oncology

## 2022-10-25 ENCOUNTER — Inpatient Hospital Stay: Payer: No Typology Code available for payment source | Attending: Hematology and Oncology

## 2022-10-25 ENCOUNTER — Inpatient Hospital Stay: Payer: No Typology Code available for payment source | Admitting: Hematology and Oncology

## 2022-10-25 VITALS — BP 116/69 | HR 58 | Temp 97.8°F | Resp 18 | Wt 144.0 lb

## 2022-10-25 DIAGNOSIS — R21 Rash and other nonspecific skin eruption: Secondary | ICD-10-CM | POA: Insufficient documentation

## 2022-10-25 DIAGNOSIS — D75839 Thrombocytosis, unspecified: Secondary | ICD-10-CM | POA: Diagnosis present

## 2022-10-25 DIAGNOSIS — D473 Essential (hemorrhagic) thrombocythemia: Secondary | ICD-10-CM

## 2022-10-25 LAB — CBC WITH DIFFERENTIAL/PLATELET
Abs Immature Granulocytes: 0.01 10*3/uL (ref 0.00–0.07)
Basophils Absolute: 0.1 10*3/uL (ref 0.0–0.1)
Basophils Relative: 1 %
Eosinophils Absolute: 0.4 10*3/uL (ref 0.0–0.5)
Eosinophils Relative: 6 %
HCT: 41.7 % (ref 39.0–52.0)
Hemoglobin: 14 g/dL (ref 13.0–17.0)
Immature Granulocytes: 0 %
Lymphocytes Relative: 23 %
Lymphs Abs: 1.5 10*3/uL (ref 0.7–4.0)
MCH: 31.6 pg (ref 26.0–34.0)
MCHC: 33.6 g/dL (ref 30.0–36.0)
MCV: 94.1 fL (ref 80.0–100.0)
Monocytes Absolute: 0.4 10*3/uL (ref 0.1–1.0)
Monocytes Relative: 7 %
Neutro Abs: 4.1 10*3/uL (ref 1.7–7.7)
Neutrophils Relative %: 63 %
Platelets: 334 10*3/uL (ref 150–400)
RBC: 4.43 MIL/uL (ref 4.22–5.81)
RDW: 16.7 % — ABNORMAL HIGH (ref 11.5–15.5)
WBC: 6.5 10*3/uL (ref 4.0–10.5)
nRBC: 0 % (ref 0.0–0.2)

## 2022-10-25 NOTE — Assessment & Plan Note (Signed)
He tolerated hydroxyurea very well His platelet count is now normal He has no side effects from treatment at all I plan to see him back again in 3 months for further follow-up

## 2022-10-25 NOTE — Progress Notes (Signed)
[facility] OFFICE PROGRESS NOTE  Patient Care Team: Default, Provider, MD as PCP - General  ASSESSMENT & PLAN:  Essential thrombocytosis (HCC) He tolerated hydroxyurea very well His platelet count is now normal He has no side effects from treatment at all I plan to see him back again in 3 months for further follow-up   Skin rash He has patchy skin rash that is not typical side-effects from hydrea I do not think this is due to [facility] I recommend conservative approach with OTC ant-histamines If it is worse, we might have to refer him to dermatology  No orders of the defined types were placed in this encounter.   All questions were answered. The patient knows to call the clinic with any problems, questions or concerns. The total time spent in the appointment was 20 minutes encounter with patients including review of chart and various tests results, discussions about plan of care and coordination of care plan   Artis Delay, MD 10/25/2022 4:18 PM  INTERVAL HISTORY: Please see below for problem oriented charting. he returns for treatment follow-up on Hydrea for ET He is compliant taking medications He noticed recent skin rash that is itchy on his torso  REVIEW OF SYSTEMS:   Constitutional: Denies fevers, chills or abnormal weight loss Eyes: Denies blurriness of vision Ears, nose, mouth, throat, and face: Denies mucositis or sore throat Respiratory: Denies cough, dyspnea or wheezes Cardiovascular: Denies palpitation, chest discomfort or lower extremity swelling Gastrointestinal:  Denies nausea, heartburn or change in bowel habits Skin: Denies abnormal skin rashes Lymphatics: Denies new lymphadenopathy or easy bruising Neurological:Denies numbness, tingling or new weaknesses Behavioral/Psych: Mood is stable, no new changes  All other systems were reviewed with the patient and are negative.  I have reviewed the past medical history, past surgical history, social  history and family history with the patient and they are unchanged from previous note.  ALLERGIES:  has no allergies on file.  MEDICATIONS:  Current Outpatient Medications  Medication Sig Dispense Refill   aspirin EC 81 MG tablet Take 81 mg by mouth daily. Swallow whole.     hydroxyurea (HYDREA) 500 MG capsule Take 2 capsules on Mondays, Wednesdays and Fridays, and 1 capsule for the rest of the week 60 capsule 3   levocetirizine (XYZAL) 5 MG tablet Take by mouth.     No current facility-administered medications for this visit.    SUMMARY OF ONCOLOGIC HISTORY: Oncology History  Essential thrombocytosis (HCC)  08/06/2022 Initial Diagnosis   Essential thrombocytosis (HCC)   08/07/2022 Pathology Results   Peripheral blood is positive for JAK2 mutation, BCR/ABL negative   08/20/2022 -  Chemotherapy   He is started on hydroxyurea     PHYSICAL EXAMINATION: ECOG PERFORMANCE STATUS: 1 - Symptomatic but completely ambulatory  Vitals:   10/25/22 1457  BP: 116/69  Pulse: (!) 58  Resp: 18  Temp: 97.8 F (36.6 C)  SpO2: 98%   Filed Weights   10/25/22 1457  Weight: 144 lb (65.3 kg)    GENERAL:alert, no distress and comfortable SKIN: noted patchy skin rash consistent with eczema EYES: normal, Conjunctiva are pink and non-injected, sclera clear OROPHARYNX:no exudate, no erythema and lips, buccal mucosa, and tongue normal  NECK: supple, thyroid normal size, non-tender, without nodularity LYMPH:  no palpable lymphadenopathy in the cervical, axillary or inguinal LUNGS: clear to auscultation and percussion with normal breathing effort HEART: regular rate & rhythm and no murmurs and no lower extremity edema ABDOMEN:abdomen soft, non-tender and normal  bowel sounds Musculoskeletal:no cyanosis of digits and no clubbing  NEURO: alert & oriented x 3 with fluent speech, no focal motor/sensory deficits  LABORATORY DATA:  I have reviewed the data as listed    Component Value Date/Time    NA 138 08/07/2022 1341   K 4.3 08/07/2022 1341   CL 103 08/07/2022 1341   CO2 32 08/07/2022 1341   GLUCOSE 115 (H) 08/07/2022 1341   BUN 20 08/07/2022 1341   CREATININE 1.01 08/07/2022 1341   CALCIUM 9.0 08/07/2022 1341   PROT 6.8 08/07/2022 1341   ALBUMIN 4.3 08/07/2022 1341   AST 16 08/07/2022 1341   ALT 18 08/07/2022 1341   ALKPHOS 53 08/07/2022 1341   BILITOT 0.5 08/07/2022 1341   GFRNONAA >60 08/07/2022 1341    No results found for: "SPEP", "UPEP"  Lab Results  Component Value Date   WBC 6.5 10/25/2022   NEUTROABS 4.1 10/25/2022   HGB 14.0 10/25/2022   HCT 41.7 10/25/2022   MCV 94.1 10/25/2022   PLT 334 10/25/2022      Chemistry      Component Value Date/Time   NA 138 08/07/2022 1341   K 4.3 08/07/2022 1341   CL 103 08/07/2022 1341   CO2 32 08/07/2022 1341   BUN 20 08/07/2022 1341   CREATININE 1.01 08/07/2022 1341      Component Value Date/Time   CALCIUM 9.0 08/07/2022 1341   ALKPHOS 53 08/07/2022 1341   AST 16 08/07/2022 1341   ALT 18 08/07/2022 1341   BILITOT 0.5 08/07/2022 1341

## 2022-10-25 NOTE — Assessment & Plan Note (Signed)
He has patchy skin rash that is not typical side-effects from hydrea I do not think this is due to [facility] I recommend conservative approach with OTC ant-histamines If it is worse, we might have to refer him to dermatology

## 2022-11-29 ENCOUNTER — Encounter: Payer: Self-pay | Admitting: Gastroenterology

## 2022-11-29 ENCOUNTER — Ambulatory Visit: Payer: No Typology Code available for payment source

## 2022-11-29 VITALS — Ht 70.0 in | Wt 145.0 lb

## 2022-11-29 DIAGNOSIS — Z1211 Encounter for screening for malignant neoplasm of colon: Secondary | ICD-10-CM

## 2022-11-29 MED ORDER — NA SULFATE-K SULFATE-MG SULF 17.5-3.13-1.6 GM/177ML PO SOLN: 1.0000 | Freq: Once | ORAL | 0 refills | Status: AC

## 2022-11-29 NOTE — Progress Notes (Signed)
No egg or soy allergy known to patient  No issues known to pt with past sedation with any surgeries or procedures Patient denies ever being told they had issues or difficulty with intubation  No FH of Malignant Hyperthermia Pt is not on diet pills Pt is not on  home 02  Pt is not on blood thinners  Pt denies issues with constipation  No A fib or A flutter Have any cardiac testing pending--no Pt instructed to use Singlecare.[email or web address] or GoodRx for a price reduction on prep  Can move independently

## 2022-12-10 ENCOUNTER — Encounter: Payer: Self-pay | Admitting: Gastroenterology

## 2022-12-10 ENCOUNTER — Ambulatory Visit: Payer: No Typology Code available for payment source | Admitting: Gastroenterology

## 2022-12-10 VITALS — BP 119/71 | HR 61 | Temp 98.3°F | Resp 11 | Ht 70.0 in | Wt 145.0 lb

## 2022-12-10 DIAGNOSIS — D122 Benign neoplasm of ascending colon: Secondary | ICD-10-CM

## 2022-12-10 DIAGNOSIS — Z1211 Encounter for screening for malignant neoplasm of colon: Secondary | ICD-10-CM

## 2022-12-10 DIAGNOSIS — D128 Benign neoplasm of rectum: Secondary | ICD-10-CM

## 2022-12-10 MED ORDER — SODIUM CHLORIDE 0.9 % IV SOLN: 500.0000 mL | Freq: Once | INTRAVENOUS | Status: DC

## 2022-12-10 NOTE — Patient Instructions (Signed)
Handouts Provided:  Polyps  YOU HAD AN ENDOSCOPIC PROCEDURE TODAY AT THE [facility]:   Refer to the procedure report that was given to you for any specific questions about what was found during the examination.  If the procedure report does not answer your questions, please call your gastroenterologist to clarify.  If you requested that your care partner not be given the details of your procedure findings, then the procedure report has been included in a sealed envelope for you to review at your convenience later.  YOU SHOULD EXPECT: Some feelings of bloating in the abdomen. Passage of more gas than usual.  Walking can help get rid of the air that was put into your GI tract during the procedure and reduce the bloating. If you had a lower endoscopy (such as a colonoscopy or flexible sigmoidoscopy) you may notice spotting of blood in your stool or on the toilet paper. If you underwent a bowel prep for your procedure, you may not have a normal bowel movement for a few days.  Please Note:  You might notice some irritation and congestion in your nose or some drainage.  This is from the oxygen used during your procedure.  There is no need for concern and it should clear up in a day or so.  SYMPTOMS TO REPORT IMMEDIATELY:  Following lower endoscopy (colonoscopy or flexible sigmoidoscopy):  Excessive amounts of blood in the stool  Significant tenderness or worsening of abdominal pains  Swelling of the abdomen that is new, acute  Fever of 100F or higher  For urgent or emergent issues, a gastroenterologist can be reached at any hour by calling [phone]. Do not use MyChart messaging for urgent concerns.    DIET:  We do recommend a small meal at first, but then you may proceed to your regular diet.  Drink plenty of fluids but you should avoid alcoholic beverages for 24 hours.  ACTIVITY:  You should plan to take it easy for the rest of today and you should NOT DRIVE or use heavy  machinery until tomorrow (because of the sedation medicines used during the test).    FOLLOW UP: Our staff will call the number listed on your records the next business day following your procedure.  We will call around 7:15- 8:00 am to check on you and address any questions or concerns that you may have regarding the information given to you following your procedure. If we do not reach you, we will leave a message.     If any biopsies were taken you will be contacted by phone or by letter within the next 1-3 weeks.  Please call us at [phone] if you have not heard about the biopsies in 3 weeks.    SIGNATURES/CONFIDENTIALITY: You and/or your care partner have signed paperwork which will be entered into your electronic medical record.  These signatures attest to the fact that that the information above on your After Visit Summary has been reviewed and is understood.  Full responsibility of the confidentiality of this discharge information lies with you and/or your care-partner.  

## 2022-12-10 NOTE — Progress Notes (Signed)
Report to PACU, RN, vss, BBS= Clear.  

## 2022-12-10 NOTE — Progress Notes (Signed)
Pt's states no medical or surgical changes since previsit or office visit. 

## 2022-12-10 NOTE — Progress Notes (Signed)
[facility] History and Physical   Primary Care Physician:  Default, Provider, MD   Reason for Procedure:   Colon cancer screening  Plan:    Colonoscopy     HPI: Grant Brooks is a 62 y.o. male undergoing average risk screening colonoscopy.  He has no family history of colon cancer and no chronic GI symptoms.  He had a normal colonoscopy in 2014 by Dr. Merri Brunette  No past medical history on file.  Past Surgical History:  Procedure Laterality Date   COLONOSCOPY      Prior to Admission medications   Medication Sig Start Date End Date Taking? Authorizing Provider  aspirin EC 81 MG tablet Take 81 mg by mouth daily. Swallow whole.   Yes [provider]  hydroxyurea (HYDREA) 500 MG capsule Take 2 capsules on Mondays, Wednesdays and Fridays, and 1 capsule for the rest of the week 08/30/22  Yes Artis Delay, MD    Current Outpatient Medications  Medication Sig Dispense Refill   aspirin EC 81 MG tablet Take 81 mg by mouth daily. Swallow whole.     hydroxyurea (HYDREA) 500 MG capsule Take 2 capsules on Mondays, Wednesdays and Fridays, and 1 capsule for the rest of the week 60 capsule 3   Current Facility-Administered Medications  Medication Dose Route Frequency Provider Last Rate Last Admin   0.9 %  sodium chloride infusion  500 mL Intravenous Once Jenel Lucks, MD        Allergies as of 12/10/2022   (No Known Allergies)    Family History  Problem Relation Age of Onset   Parkinson's disease Father    Colon cancer Neg Hx    Colon polyps Neg Hx    Esophageal cancer Neg Hx    Stomach cancer Neg Hx    Rectal cancer Neg Hx     Social History   Socioeconomic History   Marital status: Married    Spouse name: Not on file   Number of children: 3   Years of education: Not on file   Highest education level: Not on file  Occupational History   Occupation: banker  Tobacco Use   Smoking status: Never    Passive exposure: Never   Smokeless tobacco: Never   Substance and Sexual Activity   Alcohol use: Never   Drug use: Never   Sexual activity: Not on file  Other Topics Concern   Not on file  Social History Narrative   Not on file   Social Determinants of Health   Financial Resource Strain: Not on file  Food Insecurity: Not on file  Transportation Needs: Not on file  Physical Activity: Not on file  Stress: Not on file  Social Connections: Not on file  Intimate Partner Violence: Not on file    Review of Systems:  All other review of systems negative except as mentioned in the HPI.  Physical Exam: Vital signs BP 127/78   Pulse 68   Temp 98.3 F (36.8 C)   Ht 5\' 10"  (1.778 m)   Wt 145 lb (65.8 kg)   SpO2 98%   BMI 20.81 kg/m   General:   Alert,  Well-developed, well-nourished, pleasant and cooperative in NAD Airway:  Mallampati 1 Lungs:  Clear throughout to auscultation.   Heart:  Regular rate and rhythm; no murmurs, clicks, rubs,  or gallops. Abdomen:  Soft, nontender and nondistended. Normal bowel sounds.   Neuro/Psych:  Normal mood and affect. A and O x 3   Olivine Hiers E.  Candis Schatz, MD [facility]

## 2022-12-10 NOTE — Progress Notes (Signed)
Patient bit his tongue during procedure.  I had patient rinse mouth out in recovery and the bleeding has stopped prior to discharge. MD aware.

## 2022-12-10 NOTE — Op Note (Signed)
[facility] Patient Name: Grant Brooks Procedure Date: 12/10/2022 10:50 AM MRN: [id number] Endoscopist: Lorin Picket E. Tomasa Rand , MD, [id number] Age: 62 Referring MD:  Date of Birth: [birth year] Gender: Male Account #: [id number] Procedure:                Colonoscopy Indications:              Screening for colorectal malignant neoplasm (last                            colonoscopy was 10 years ago) Medicines:                Monitored Anesthesia Care Procedure:                Pre-Anesthesia Assessment:                           - Prior to the procedure, a History and Physical                            was performed, and patient medications and                            allergies were reviewed. The patient's tolerance of                            previous anesthesia was also reviewed. The risks                            and benefits of the procedure and the sedation                            options and risks were discussed with the patient.                            All questions were answered, and informed consent                            was obtained. Prior Anticoagulants: The patient has                            taken no anticoagulant or antiplatelet agents. ASA                            Grade Assessment: II - A patient with mild systemic                            disease. After reviewing the risks and benefits,                            the patient was deemed in satisfactory condition to                            undergo the procedure.  After obtaining informed consent, the colonoscope                            was passed under direct vision. Throughout the                            procedure, the patient's blood pressure, pulse, and                            oxygen saturations were monitored continuously. The                            Olympus PCF-H190DL (#[id number]) Colonoscope was                            introduced through the anus  and advanced to the the                            terminal ileum, with identification of the                            appendiceal orifice and IC valve. The colonoscopy                            was performed without difficulty. The patient                            tolerated the procedure well. The quality of the                            bowel preparation was good. The terminal ileum,                            ileocecal valve, appendiceal orifice, and rectum                            were photographed. The bowel preparation used was                            SUPREP via split dose instruction. Scope In: 10:56:57 AM Scope Out: 11:12:19 AM Scope Withdrawal Time: 0 hours 12 minutes 3 seconds  Total Procedure Duration: 0 hours 15 minutes 22 seconds  Findings:                 The perianal and digital rectal examinations were                            normal. Pertinent negatives include normal                            sphincter tone and no palpable rectal lesions.                           A 5 mm polyp was found in the rectum. The polyp was  sessile. The polyp was removed with a cold snare.                            Resection and retrieval were complete. Estimated                            blood loss was minimal.                           Two sessile polyps were found in the ascending                            colon. The polyps were 5 to 6 mm in size. These                            polyps were removed with a cold snare. Resection                            and retrieval were complete. Estimated blood loss                            was minimal.                           The exam was otherwise normal throughout the                            examined colon.                           The terminal ileum appeared normal.                           The retroflexed view of the distal rectum and anal                            verge was normal and showed no  anal or rectal                            abnormalities. Complications:            No immediate complications. Estimated Blood Loss:     Estimated blood loss was minimal. Impression:               - One 5 mm polyp in the rectum, removed with a cold                            snare. Resected and retrieved.                           - Two 5 to 6 mm polyps in the ascending colon,                            removed with a cold snare. Resected and retrieved.                           -  The examined portion of the ileum was normal.                           - The distal rectum and anal verge are normal on                            retroflexion view. Recommendation:           - Patient has a contact number available for                            emergencies. The signs and symptoms of potential                            delayed complications were discussed with the                            patient. Return to normal activities tomorrow.                            Written discharge instructions were provided to the                            patient.                           - Resume previous diet.                           - Continue present medications.                           - Await pathology results.                           - Repeat colonoscopy (date not yet determined) for                            surveillance based on pathology results. Philipp Callegari E. Tomasa Rand, MD 12/10/2022 11:19:18 AM This report has been signed electronically.

## 2022-12-10 NOTE — Progress Notes (Signed)
Called to room to assist during endoscopic procedure.  Patient ID and intended procedure confirmed with present staff. Received instructions for my participation in the procedure from the performing physician.  

## 2022-12-11 ENCOUNTER — Telehealth: Payer: Self-pay

## 2022-12-11 NOTE — Telephone Encounter (Signed)
Attempted f/u call. No answer. Left VM 

## 2022-12-13 NOTE — Progress Notes (Signed)
Mr. Ridolfi,   The three polyps that I removed during your recent procedure were completely benign but were proven to be "pre-cancerous" polyps that MAY have grown into cancers if they had not been removed.  Studies shows that at least 20% of women over age 63 and 30% of men over age 50 have pre-cancerous polyps.  Based on current nationally recognized surveillance guidelines, I recommend that you have a repeat colonoscopy in 5 years.   If you develop any new rectal bleeding, abdominal pain or significant bowel habit changes, please contact me before then.

## 2023-01-24 ENCOUNTER — Other Ambulatory Visit: Payer: Self-pay

## 2023-01-24 ENCOUNTER — Encounter: Payer: Self-pay | Admitting: Hematology and Oncology

## 2023-01-24 ENCOUNTER — Inpatient Hospital Stay (HOSPITAL_BASED_OUTPATIENT_CLINIC_OR_DEPARTMENT_OTHER): Payer: No Typology Code available for payment source | Admitting: Hematology and Oncology

## 2023-01-24 ENCOUNTER — Inpatient Hospital Stay: Payer: No Typology Code available for payment source | Attending: Hematology and Oncology

## 2023-01-24 ENCOUNTER — Ambulatory Visit: Payer: No Typology Code available for payment source | Admitting: Hematology and Oncology

## 2023-01-24 VITALS — BP 129/66 | HR 62 | Temp 98.1°F | Resp 18 | Ht 70.0 in | Wt 144.8 lb

## 2023-01-24 DIAGNOSIS — D473 Essential (hemorrhagic) thrombocythemia: Secondary | ICD-10-CM | POA: Diagnosis not present

## 2023-01-24 DIAGNOSIS — D75839 Thrombocytosis, unspecified: Secondary | ICD-10-CM | POA: Insufficient documentation

## 2023-01-24 LAB — CBC WITH DIFFERENTIAL/PLATELET
Abs Immature Granulocytes: 0.02 10*3/uL (ref 0.00–0.07)
Basophils Absolute: 0.1 10*3/uL (ref 0.0–0.1)
Basophils Relative: 1 %
Eosinophils Absolute: 0.4 10*3/uL (ref 0.0–0.5)
Eosinophils Relative: 7 %
HCT: 41.2 % (ref 39.0–52.0)
Hemoglobin: 14 g/dL (ref 13.0–17.0)
Immature Granulocytes: 0 %
Lymphocytes Relative: 21 %
Lymphs Abs: 1 10*3/uL (ref 0.7–4.0)
MCH: 34 pg (ref 26.0–34.0)
MCHC: 34 g/dL (ref 30.0–36.0)
MCV: 100 fL (ref 80.0–100.0)
Monocytes Absolute: 0.4 10*3/uL (ref 0.1–1.0)
Monocytes Relative: 8 %
Neutro Abs: 3.1 10*3/uL (ref 1.7–7.7)
Neutrophils Relative %: 63 %
Platelets: 284 10*3/uL (ref 150–400)
RBC: 4.12 MIL/uL — ABNORMAL LOW (ref 4.22–5.81)
RDW: 12.3 % (ref 11.5–15.5)
WBC: 5 10*3/uL (ref 4.0–10.5)
nRBC: 0 % (ref 0.0–0.2)

## 2023-01-24 NOTE — Progress Notes (Signed)
[facility] OFFICE PROGRESS NOTE  Patient Care Team: Default, Provider, MD as PCP - General  ASSESSMENT & PLAN:  Essential thrombocytosis (HCC) He tolerated hydroxyurea very well His platelet count is now normal He has no side effects from treatment at all I plan to see him back again in 4 months for further follow-up I plan to space out future appointment due to stability   No orders of the defined types were placed in this encounter.   All questions were answered. The patient knows to call the clinic with any problems, questions or concerns. The total time spent in the appointment was 20 minutes encounter with patients including review of chart and various tests results, discussions about plan of care and coordination of care plan   Artis Delay, MD 01/24/2023 4:03 PM  INTERVAL HISTORY: Please see below for problem oriented charting. he returns for treatment follow-up He is doing well No side-effects We discussed results and plan of care  REVIEW OF SYSTEMS:   Constitutional: Denies fevers, chills or abnormal weight loss Eyes: Denies blurriness of vision Ears, nose, mouth, throat, and face: Denies mucositis or sore throat Respiratory: Denies cough, dyspnea or wheezes Cardiovascular: Denies palpitation, chest discomfort or lower extremity swelling Gastrointestinal:  Denies nausea, heartburn or change in bowel habits Skin: Denies abnormal skin rashes Lymphatics: Denies new lymphadenopathy or easy bruising Neurological:Denies numbness, tingling or new weaknesses Behavioral/Psych: Mood is stable, no new changes  All other systems were reviewed with the patient and are negative.  I have reviewed the past medical history, past surgical history, social history and family history with the patient and they are unchanged from previous note.  ALLERGIES:  has No Known Allergies.  MEDICATIONS:  Current Outpatient Medications  Medication Sig Dispense Refill   aspirin  EC 81 MG tablet Take 81 mg by mouth daily. Swallow whole.     hydroxyurea (HYDREA) 500 MG capsule Take 2 capsules on Mondays, Wednesdays and Fridays, and 1 capsule for the rest of the week 60 capsule 3   No current facility-administered medications for this visit.    SUMMARY OF ONCOLOGIC HISTORY: Oncology History  Essential thrombocytosis (HCC)  08/06/2022 Initial Diagnosis   Essential thrombocytosis (HCC)   08/07/2022 Pathology Results   Peripheral blood is positive for JAK2 mutation, BCR/ABL negative   08/20/2022 -  Chemotherapy   He is started on hydroxyurea     PHYSICAL EXAMINATION: ECOG PERFORMANCE STATUS: 0 - Asymptomatic  Vitals:   01/24/23 1459  BP: 129/66  Pulse: 62  Resp: 18  Temp: 98.1 F (36.7 C)  SpO2: 98%   Filed Weights   01/24/23 1459  Weight: 144 lb 12.8 oz (65.7 kg)    GENERAL:alert, no distress and comfortable NEURO: alert & oriented x 3 with fluent speech, no focal motor/sensory deficits  LABORATORY DATA:  I have reviewed the data as listed    Component Value Date/Time   NA 138 08/07/2022 1341   K 4.3 08/07/2022 1341   CL 103 08/07/2022 1341   CO2 32 08/07/2022 1341   GLUCOSE 115 (H) 08/07/2022 1341   BUN 20 08/07/2022 1341   CREATININE 1.01 08/07/2022 1341   CALCIUM 9.0 08/07/2022 1341   PROT 6.8 08/07/2022 1341   ALBUMIN 4.3 08/07/2022 1341   AST 16 08/07/2022 1341   ALT 18 08/07/2022 1341   ALKPHOS 53 08/07/2022 1341   BILITOT 0.5 08/07/2022 1341   GFRNONAA >60 08/07/2022 1341    No results found for: "SPEP", "UPEP"  Lab Results  Component Value Date   WBC 5.0 01/24/2023   NEUTROABS 3.1 01/24/2023   HGB 14.0 01/24/2023   HCT 41.2 01/24/2023   MCV 100.0 01/24/2023   PLT 284 01/24/2023      Chemistry      Component Value Date/Time   NA 138 08/07/2022 1341   K 4.3 08/07/2022 1341   CL 103 08/07/2022 1341   CO2 32 08/07/2022 1341   BUN 20 08/07/2022 1341   CREATININE 1.01 08/07/2022 1341      Component Value Date/Time    CALCIUM 9.0 08/07/2022 1341   ALKPHOS 53 08/07/2022 1341   AST 16 08/07/2022 1341   ALT 18 08/07/2022 1341   BILITOT 0.5 08/07/2022 1341

## 2023-01-24 NOTE — Assessment & Plan Note (Signed)
He tolerated hydroxyurea very well His platelet count is now normal He has no side effects from treatment at all I plan to see him back again in 4 months for further follow-up I plan to space out future appointment due to stability

## 2023-04-26 ENCOUNTER — Other Ambulatory Visit: Payer: Self-pay

## 2023-04-26 ENCOUNTER — Encounter: Payer: Self-pay | Admitting: Hematology and Oncology

## 2023-04-26 ENCOUNTER — Telehealth: Payer: Self-pay

## 2023-04-26 MED ORDER — HYDROXYUREA 500 MG PO CAPS: ORAL_CAPSULE | ORAL | 3 refills | Status: DC

## 2023-04-26 NOTE — Telephone Encounter (Signed)
Returned his call. Sent refill of Hydrea to preferred pharmacy.

## 2023-04-30 ENCOUNTER — Telehealth: Payer: Self-pay

## 2023-04-30 NOTE — Telephone Encounter (Signed)
Called regarding mychart message. Called [facility] and he picked up the old Hydrea Rx on 11/1 instead of the new Rx. [facility] has the new Rx and insurance will not pay until 11/12 for refill. He verbalized understanding.

## 2023-05-20 ENCOUNTER — Other Ambulatory Visit: Payer: Self-pay | Admitting: Hematology and Oncology

## 2023-05-30 ENCOUNTER — Inpatient Hospital Stay: Payer: No Typology Code available for payment source | Attending: Hematology and Oncology

## 2023-05-30 ENCOUNTER — Encounter: Payer: Self-pay | Admitting: Hematology and Oncology

## 2023-05-30 ENCOUNTER — Inpatient Hospital Stay: Payer: No Typology Code available for payment source | Admitting: Hematology and Oncology

## 2023-05-30 VITALS — BP 129/78 | HR 64 | Temp 97.6°F | Resp 18 | Ht 70.0 in | Wt 146.2 lb

## 2023-05-30 DIAGNOSIS — D473 Essential (hemorrhagic) thrombocythemia: Secondary | ICD-10-CM

## 2023-05-30 DIAGNOSIS — D75839 Thrombocytosis, unspecified: Secondary | ICD-10-CM | POA: Insufficient documentation

## 2023-05-30 LAB — CBC WITH DIFFERENTIAL/PLATELET
Abs Immature Granulocytes: 0.02 10*3/uL (ref 0.00–0.07)
Basophils Absolute: 0 10*3/uL (ref 0.0–0.1)
Basophils Relative: 1 %
Eosinophils Absolute: 0.4 10*3/uL (ref 0.0–0.5)
Eosinophils Relative: 7 %
HCT: 43.5 % (ref 39.0–52.0)
Hemoglobin: 14.8 g/dL (ref 13.0–17.0)
Immature Granulocytes: 0 %
Lymphocytes Relative: 23 %
Lymphs Abs: 1.1 10*3/uL (ref 0.7–4.0)
MCH: 34.1 pg — ABNORMAL HIGH (ref 26.0–34.0)
MCHC: 34 g/dL (ref 30.0–36.0)
MCV: 100.2 fL — ABNORMAL HIGH (ref 80.0–100.0)
Monocytes Absolute: 0.4 10*3/uL (ref 0.1–1.0)
Monocytes Relative: 8 %
Neutro Abs: 3.1 10*3/uL (ref 1.7–7.7)
Neutrophils Relative %: 61 %
Platelets: 297 10*3/uL (ref 150–400)
RBC: 4.34 MIL/uL (ref 4.22–5.81)
RDW: 11.9 % (ref 11.5–15.5)
WBC: 5.1 10*3/uL (ref 4.0–10.5)
nRBC: 0 % (ref 0.0–0.2)

## 2023-05-30 NOTE — Progress Notes (Signed)
[facility] OFFICE PROGRESS NOTE  Patient Care Team: Default, Provider, MD as PCP - General  ASSESSMENT & PLAN:  Essential thrombocytosis (HCC) He tolerated hydroxyurea very well His platelet count is normal and rest of CBC is stable He has no side effects from treatment at all I plan to see him back again in 6 months for further follow-up  No orders of the defined types were placed in this encounter.   All questions were answered. The patient knows to call the clinic with any problems, questions or concerns. The total time spent in the appointment was 20 minutes encounter with patients including review of chart and various tests results, discussions about plan of care and coordination of care plan   Artis Delay, MD 05/30/2023 3:00 PM  INTERVAL HISTORY: Please see below for problem oriented charting. he returns for treatment for ET on Hydrea He denies side-effects No infection/bleeding  REVIEW OF SYSTEMS:   Constitutional: Denies fevers, chills or abnormal weight loss Eyes: Denies blurriness of vision Ears, nose, mouth, throat, and face: Denies mucositis or sore throat Respiratory: Denies cough, dyspnea or wheezes Cardiovascular: Denies palpitation, chest discomfort or lower extremity swelling Gastrointestinal:  Denies nausea, heartburn or change in bowel habits Skin: Denies abnormal skin rashes Lymphatics: Denies new lymphadenopathy or easy bruising Neurological:Denies numbness, tingling or new weaknesses Behavioral/Psych: Mood is stable, no new changes  All other systems were reviewed with the patient and are negative.  I have reviewed the past medical history, past surgical history, social history and family history with the patient and they are unchanged from previous note.  ALLERGIES:  has No Known Allergies.  MEDICATIONS:  Current Outpatient Medications  Medication Sig Dispense Refill   aspirin EC 81 MG tablet Take 81 mg by mouth daily. Swallow whole.      hydroxyurea (HYDREA) 500 MG capsule TAKE 1 CAPSULE BY MOUTH EVERY DAY WITH FOOD TO MINIMIZE STOMACH SIDE EFFECTS 30 capsule 3   No current facility-administered medications for this visit.    SUMMARY OF ONCOLOGIC HISTORY: Oncology History  Essential thrombocytosis (HCC)  08/06/2022 Initial Diagnosis   Essential thrombocytosis (HCC)   08/07/2022 Pathology Results   Peripheral blood is positive for JAK2 mutation, BCR/ABL negative   08/20/2022 -  Chemotherapy   He is started on hydroxyurea     PHYSICAL EXAMINATION: ECOG PERFORMANCE STATUS: 0 - Asymptomatic  Vitals:   05/30/23 1456  BP: 129/78  Pulse: 64  Resp: 18  Temp: 97.6 F (36.4 C)  SpO2: 98%   Filed Weights   05/30/23 1456  Weight: 146 lb 3.2 oz (66.3 kg)    GENERAL:alert, no distress and comfortable   LABORATORY DATA:  I have reviewed the data as listed    Component Value Date/Time   NA 138 08/07/2022 1341   K 4.3 08/07/2022 1341   CL 103 08/07/2022 1341   CO2 32 08/07/2022 1341   GLUCOSE 115 (H) 08/07/2022 1341   BUN 20 08/07/2022 1341   CREATININE 1.01 08/07/2022 1341   CALCIUM 9.0 08/07/2022 1341   PROT 6.8 08/07/2022 1341   ALBUMIN 4.3 08/07/2022 1341   AST 16 08/07/2022 1341   ALT 18 08/07/2022 1341   ALKPHOS 53 08/07/2022 1341   BILITOT 0.5 08/07/2022 1341   GFRNONAA >60 08/07/2022 1341    No results found for: "SPEP", "UPEP"  Lab Results  Component Value Date   WBC 5.1 05/30/2023   NEUTROABS 3.1 05/30/2023   HGB 14.8 05/30/2023   HCT 43.5 05/30/2023  MCV 100.2 (H) 05/30/2023   PLT 297 05/30/2023      Chemistry      Component Value Date/Time   NA 138 08/07/2022 1341   K 4.3 08/07/2022 1341   CL 103 08/07/2022 1341   CO2 32 08/07/2022 1341   BUN 20 08/07/2022 1341   CREATININE 1.01 08/07/2022 1341      Component Value Date/Time   CALCIUM 9.0 08/07/2022 1341   ALKPHOS 53 08/07/2022 1341   AST 16 08/07/2022 1341   ALT 18 08/07/2022 1341   BILITOT 0.5 08/07/2022 1341

## 2023-05-30 NOTE — Assessment & Plan Note (Signed)
He tolerated hydroxyurea very well His platelet count is normal and rest of CBC is stable He has no side effects from treatment at all I plan to see him back again in 6 months for further follow-up

## 2023-09-27 ENCOUNTER — Other Ambulatory Visit: Payer: Self-pay | Admitting: Hematology and Oncology

## 2023-11-26 ENCOUNTER — Encounter: Payer: Self-pay | Admitting: Hematology and Oncology

## 2023-11-28 ENCOUNTER — Encounter: Payer: Self-pay | Admitting: Hematology and Oncology

## 2023-11-28 ENCOUNTER — Inpatient Hospital Stay: Payer: No Typology Code available for payment source

## 2023-11-28 ENCOUNTER — Inpatient Hospital Stay
Payer: No Typology Code available for payment source | Attending: Hematology and Oncology | Admitting: Hematology and Oncology

## 2023-11-28 VITALS — BP 115/74 | HR 58 | Temp 97.5°F | Resp 18 | Ht 70.0 in | Wt 139.8 lb

## 2023-11-28 DIAGNOSIS — R634 Abnormal weight loss: Secondary | ICD-10-CM | POA: Insufficient documentation

## 2023-11-28 DIAGNOSIS — D75839 Thrombocytosis, unspecified: Secondary | ICD-10-CM | POA: Diagnosis not present

## 2023-11-28 DIAGNOSIS — D473 Essential (hemorrhagic) thrombocythemia: Secondary | ICD-10-CM | POA: Diagnosis not present

## 2023-11-28 MED ORDER — HYDROXYUREA 500 MG PO CAPS: ORAL_CAPSULE | ORAL | 1 refills | Status: DC

## 2023-11-28 NOTE — Assessment & Plan Note (Addendum)
 The patient had lost a lot of weight since her last visit The patient is very active and workout at the gym daily In going through his diet history, he has not been getting enough calories and protein intake Advised the patient to add additional calories and higher protein intake

## 2023-11-28 NOTE — Assessment & Plan Note (Addendum)
 He tolerated hydroxyurea  very well His platelet count is normal and rest of CBC is stable He has no side effects from treatment at all I plan to see him back again in 6 months for further follow-up The patient will be getting his blood count monitored by primary care doctor every 6 months He would like his future visit to be converted to virtual visit which I think is reasonable

## 2023-11-28 NOTE — Progress Notes (Signed)
 [facility] OFFICE PROGRESS NOTE  Patient Care Team: Default, Provider, MD as PCP - General  Assessment & Plan Essential thrombocytosis (HCC) He tolerated hydroxyurea  very well His platelet count is normal and rest of CBC is stable He has no side effects from treatment at all I plan to see him back again in 6 months for further follow-up The patient will be getting his blood count monitored by primary care doctor every 6 months He would like his future visit to be converted to virtual visit which I think is reasonable Weight loss The patient had lost a lot of weight since her last visit The patient is very active and workout at the gym daily In going through his diet history, he has not been getting enough calories and protein intake Advised the patient to add additional calories and higher protein intake  No orders of the defined types were placed in this encounter.    Almeda Jacobs, MD  INTERVAL HISTORY: he returns for surveillance follow-up for myeloproliferative disorder/neoplasm Patient denies recent bleeding such as epistaxis, hematuria or hematochezia No recent infection We reviewed medication list and discussed medication changes We discussed test results and future plan of care as outlined above  PHYSICAL EXAMINATION: ECOG PERFORMANCE STATUS: 0 - Asymptomatic  Vitals:   11/28/23 1514  BP: 115/74  Pulse: (!) 58  Resp: 18  Temp: (!) 97.5 F (36.4 C)  SpO2: 98%   Lab Results  Component Value Date   WBC 5.1 05/30/2023   HGB 14.8 05/30/2023   HCT 43.5 05/30/2023   MCV 100.2 (H) 05/30/2023   PLT 297 05/30/2023   Oncology History  Essential thrombocytosis (HCC)  08/06/2022 Initial Diagnosis   Essential thrombocytosis (HCC)   08/07/2022 Pathology Results   Peripheral blood is positive for JAK2 mutation, BCR/ABL negative   08/20/2022 -  Chemotherapy   He is started on hydroxyurea 

## 2024-04-30 ENCOUNTER — Encounter: Payer: Self-pay | Admitting: Hematology and Oncology

## 2024-05-25 ENCOUNTER — Other Ambulatory Visit: Payer: Self-pay | Admitting: Hematology and Oncology

## 2024-05-29 ENCOUNTER — Encounter: Payer: Self-pay | Admitting: Hematology and Oncology

## 2024-05-29 ENCOUNTER — Inpatient Hospital Stay: Attending: Hematology and Oncology | Admitting: Hematology and Oncology

## 2024-05-29 DIAGNOSIS — D473 Essential (hemorrhagic) thrombocythemia: Secondary | ICD-10-CM | POA: Diagnosis not present

## 2024-05-29 DIAGNOSIS — D75839 Thrombocytosis, unspecified: Secondary | ICD-10-CM | POA: Insufficient documentation

## 2024-05-29 NOTE — Progress Notes (Signed)
   HEMATOLOGY-ONCOLOGY ELECTRONIC VISIT PROGRESS NOTE  Patient Care Team: Default, Provider, MD as PCP - General  I connected with the patient via telephone conference and verified that I am speaking with the correct person using two identifiers. The patient's location is at home and I am providing care from the [facility] I discussed the limitations, risks, security and privacy concerns of performing an evaluation and management service by e-visits and the availability of in person appointments.  I also discussed with the patient that there may be a patient responsible charge related to this service. The patient expressed understanding and agreed to proceed.   ASSESSMENT & PLAN:  Essential thrombocytosis (HCC) The patient was noticed with myeloproliferative disorder in February 2024 Peripheral blood was positive for JAK2 mutation and he was started on hydroxyurea  He tolerated hydroxyurea  very well His platelet count is normal and rest of CBC is stable He has no side effects from treatment at all I plan to see him back again in 6 months for further follow-up The patient will be getting his blood count monitored by primary care doctor every 6 months He would like his future visit to be converted to virtual visit which I think is reasonable  No orders of the defined types were placed in this encounter.   INTERVAL HISTORY: Please see below for problem oriented charting. The purpose of today's discussion is to review test results He tolerated hydroxyurea  well He denies side effects I reviewed recent CBC result and we discussed plan of care  SUMMARY OF ONCOLOGIC HISTORY: Oncology History  Essential thrombocytosis (HCC)  08/06/2022 Initial Diagnosis   Essential thrombocytosis (HCC)   08/07/2022 Pathology Results   Peripheral blood is positive for JAK2 mutation, BCR/ABL negative   08/20/2022 -  Chemotherapy   He is started on hydroxyurea     I discussed the assessment and  treatment plan with the patient. The patient was provided an opportunity to ask questions and all were answered. The patient agreed with the plan and demonstrated an understanding of the instructions. The patient was advised to call back or seek an in-person evaluation if the symptoms worsen or if the condition fails to improve as anticipated.    I spent 20 minutes for the appointment reviewing test results, discuss management and coordination of care.  Almarie Bedford, MD 05/29/2024 1:53 PM

## 2024-05-29 NOTE — Assessment & Plan Note (Signed)
 The patient was noticed with myeloproliferative disorder in February 2024 Peripheral blood was positive for JAK2 mutation and he was started on hydroxyurea  He tolerated hydroxyurea  very well His platelet count is normal and rest of CBC is stable He has no side effects from treatment at all I plan to see him back again in 6 months for further follow-up The patient will be getting his blood count monitored by primary care doctor every 6 months He would like his future visit to be converted to virtual visit which I think is reasonable

## 2024-11-27 ENCOUNTER — Inpatient Hospital Stay: Admitting: Hematology and Oncology
